# Patient Record
Sex: Male | Born: 2000 | Race: White | Hispanic: No | Marital: Single | Smoking: Never smoker
Health system: Southern US, Community
[De-identification: ages and names within clinical notes are randomized; demographics above are authoritative.]

## PROBLEM LIST (undated history)

## (undated) DIAGNOSIS — F909 Attention-deficit hyperactivity disorder, unspecified type: Secondary | ICD-10-CM

---
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
## 2019-01-06 ENCOUNTER — Telehealth: Payer: Self-pay | Admitting: Family Medicine

## 2019-01-06 ENCOUNTER — Telehealth: Payer: Self-pay | Admitting: *Deleted

## 2019-01-06 ENCOUNTER — Other Ambulatory Visit: Payer: 59

## 2019-01-06 ENCOUNTER — Other Ambulatory Visit: Payer: Self-pay | Admitting: Family Medicine

## 2019-01-06 DIAGNOSIS — R6889 Other general symptoms and signs: Secondary | ICD-10-CM | POA: Diagnosis not present

## 2019-01-06 DIAGNOSIS — Z20822 Contact with and (suspected) exposure to covid-19: Secondary | ICD-10-CM

## 2019-01-06 NOTE — Telephone Encounter (Signed)
I spoke with his father who is going to take him to the testing site.    Appt made for 01/06/2019 at 11:00.  Per Dr. Mila Merry.  It's at the [facility] location in [place].   He was made aware to stay in the car and wear their masks when they drive up to the testing area. He is taking his insurance card with him.

## 2019-01-06 NOTE — Telephone Encounter (Signed)
Received message that patient had rapid onset fevers, malaise, aches, fatigue, nausea and vomiting over night. Is scheduled to start basic training in 9 days. Concerned it may be Covid-19

## 2019-01-09 LAB — NOVEL CORONAVIRUS, NAA: SARS-CoV-2, NAA: NOT DETECTED

## 2019-04-05 IMAGING — US US BREAST*L* LIMITED INC AXILLA
1 series · 3 of 3 positions shown · non-contrast
Comparison: None.

CLINICAL DATA: Patient presents for a diagnostic left breast
recently. No pain/tenderness. No family history breast cancer. No
nipple discharge.

EXAM:
ULTRASOUND OF THE LEFT BREAST

[Series 1: us breast*left* limited inc axilla · 0.05mm/px · 3 of 3 slices shown]
[im 1/3]
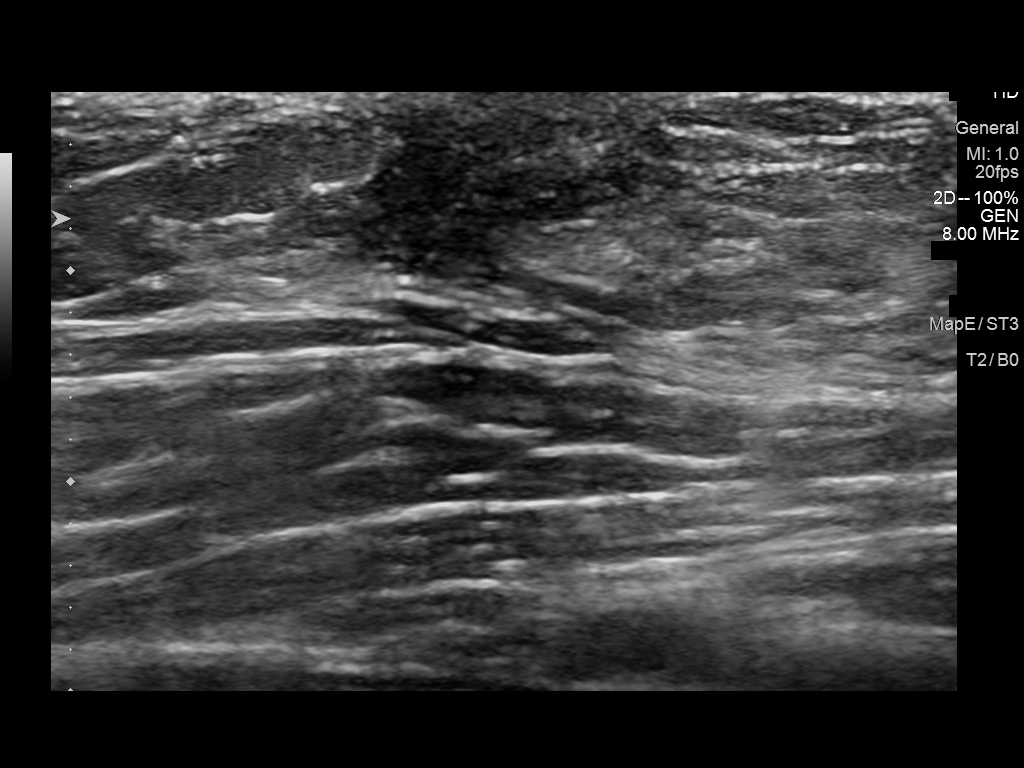
[im 2/3]
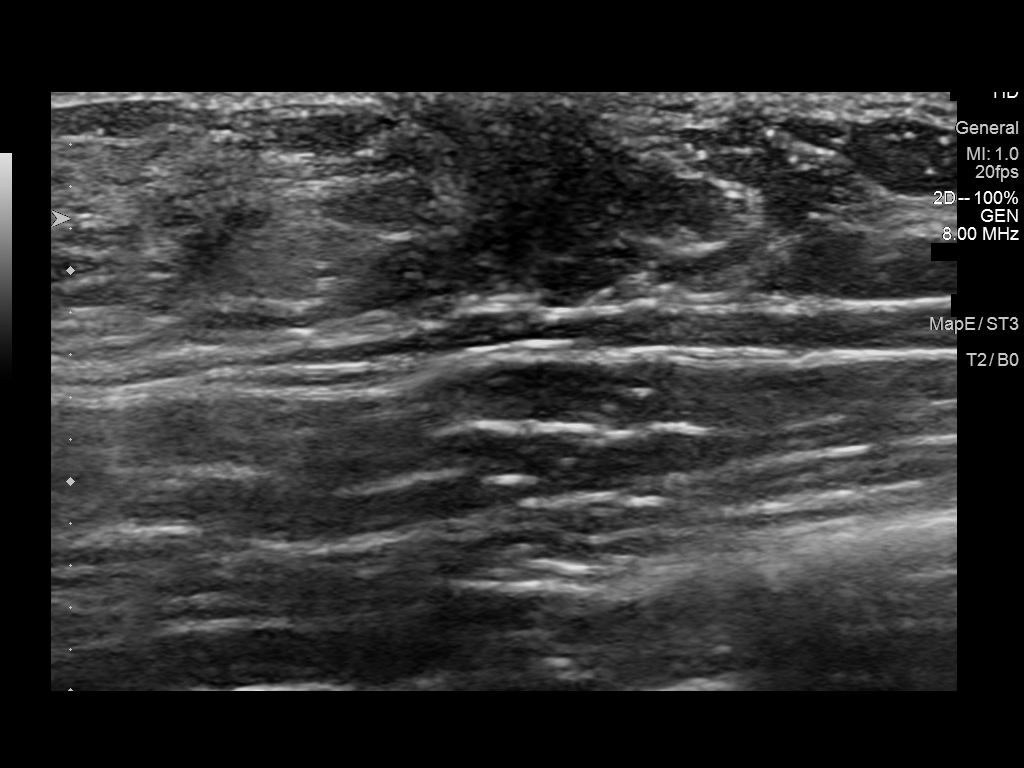
[im 3/3]
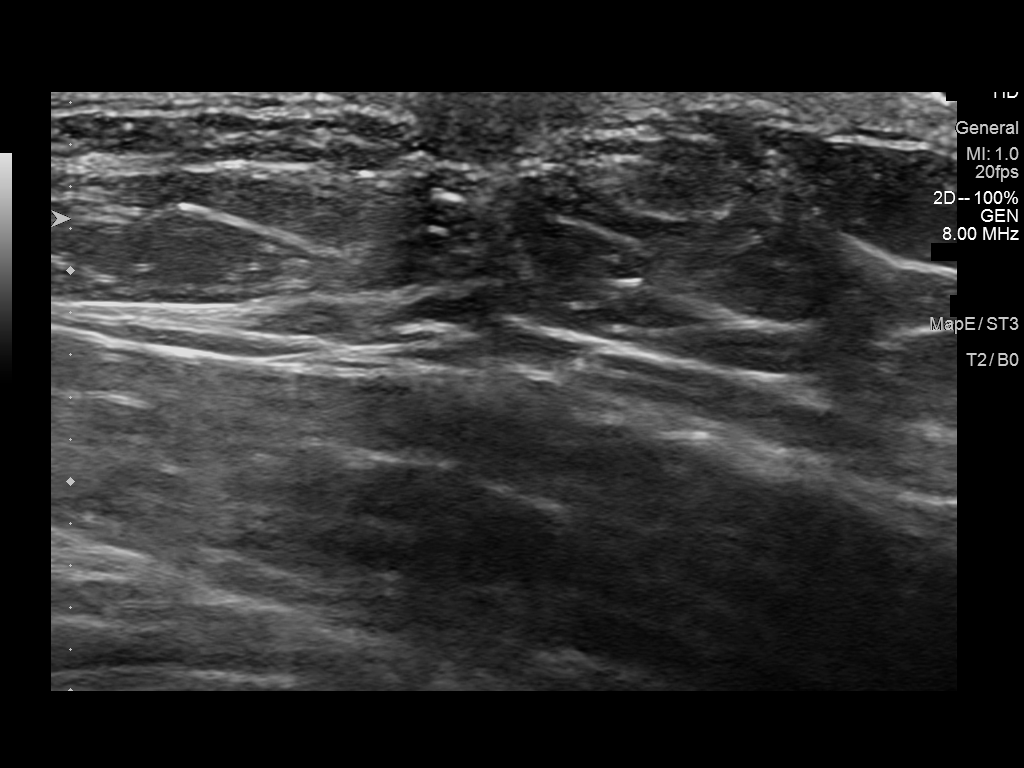

[3 of 3 positions shown; findings below may reference images not displayed]

FINDINGS: On physical exam, there is mild soft fullness to the left
retroareolar region without discrete focal mass.

Targeted ultrasound is performed, showing mild decreased
echogenicity in the central left retroareolar region typical in
appearance for gynecomastia. Comparison right retroareolar region is
normal.
IMPRESSION: Mild left-sided gynecomastia.

RECOMMENDATION:
Recommend clinical correlation as to the possible etiology of this
gynecomastia. Also recommend continued management on a clinical
basis. Patient was instructed to continue self-breast examination
and to return for re-evaluation if significant increase in size.

I have discussed the findings and recommendations with the patient.
Results were also provided in writing at the conclusion of the
visit. If applicable, a reminder letter will be sent to the patient
regarding the next appointment.

BI-RADS CATEGORY  2: Benign.

## 2021-02-13 DIAGNOSIS — H5213 Myopia, bilateral: Secondary | ICD-10-CM | POA: Diagnosis not present
